# Patient Record
Sex: Female | Born: 2000 | Race: White | Hispanic: No | Marital: Single | Smoking: Never smoker
Health system: Southern US, Community
[De-identification: ages and names within clinical notes are randomized; demographics above are authoritative.]

## PROBLEM LIST (undated history)

## (undated) DIAGNOSIS — J45909 Unspecified asthma, uncomplicated: Secondary | ICD-10-CM

## (undated) HISTORY — PX: ABDOMINAL SURGERY: SHX537

## (undated) HISTORY — PX: HERNIA REPAIR: SHX51

## (undated) HISTORY — PX: APPENDECTOMY: SHX54

---
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
## 2018-12-30 ENCOUNTER — Other Ambulatory Visit: Payer: Self-pay | Admitting: Allergy

## 2018-12-30 ENCOUNTER — Ambulatory Visit
Admission: RE | Admit: 2018-12-30 | Discharge: 2018-12-30 | Disposition: A | Discharge status: Home / Self Care | Payer: BC Managed Care – PPO | Source: Ambulatory Visit | Attending: Allergy | Admitting: Allergy

## 2018-12-30 DIAGNOSIS — J4530 Mild persistent asthma, uncomplicated: Secondary | ICD-10-CM | POA: Diagnosis not present

## 2019-12-31 ENCOUNTER — Encounter: Payer: Self-pay | Admitting: Emergency Medicine

## 2019-12-31 ENCOUNTER — Other Ambulatory Visit: Payer: Self-pay

## 2019-12-31 ENCOUNTER — Ambulatory Visit
Admission: EM | Admit: 2019-12-31 | Discharge: 2019-12-31 | Disposition: A | Discharge status: Home / Self Care | Payer: BC Managed Care – PPO | Attending: Family Medicine | Admitting: Family Medicine

## 2019-12-31 DIAGNOSIS — Z20822 Contact with and (suspected) exposure to covid-19: Secondary | ICD-10-CM | POA: Insufficient documentation

## 2019-12-31 DIAGNOSIS — J0100 Acute maxillary sinusitis, unspecified: Secondary | ICD-10-CM | POA: Diagnosis not present

## 2019-12-31 DIAGNOSIS — J9801 Acute bronchospasm: Secondary | ICD-10-CM | POA: Diagnosis not present

## 2019-12-31 DIAGNOSIS — J069 Acute upper respiratory infection, unspecified: Secondary | ICD-10-CM | POA: Diagnosis present

## 2019-12-31 LAB — SARS CORONAVIRUS 2 (TAT 6-24 HRS): SARS Coronavirus 2: NEGATIVE

## 2019-12-31 MED ORDER — PREDNISONE 20 MG PO TABS: 40.0000 mg | ORAL_TABLET | Freq: Every day | ORAL | 0 refills | Status: DC

## 2019-12-31 MED ORDER — HYDROCOD POLST-CPM POLST ER 10-8 MG/5ML PO SUER: 5.0000 mL | Freq: Every evening | ORAL | 0 refills | Status: DC | PRN

## 2019-12-31 MED ORDER — DOXYCYCLINE HYCLATE 100 MG PO CAPS: 100.0000 mg | ORAL_CAPSULE | Freq: Two times a day (BID) | ORAL | 0 refills | Status: DC

## 2019-12-31 NOTE — Discharge Instructions (Addendum)
Take medication as prescribed. Rest. Drink plenty of fluids. Continue home mucinex and albuterol inhaler as needed.   Follow up with your primary care physician this week as needed. Return to Urgent care for new or worsening concerns.

## 2019-12-31 NOTE — ED Triage Notes (Signed)
Patient c/o cough, chest congestion, sinus congestion, runny nose, and HAs for the past week.  Patient denies fevers.

## 2019-12-31 NOTE — ED Provider Notes (Signed)
[facility] ____________________________________________  Time seen: Approximately 12:51 PM  I have reviewed the triage vital signs and the nursing notes.   HISTORY  Chief Complaint Cough and Nasal Congestion   HPI Briana Fields is a 19 y.o. female present with father at bedside for evaluation of 1 week of cough and congestion complaints.  Patient reports initially started off with sinus congestion and sinus pressure that with postnasal drainage led to chest congestion and coughing.  States she has occasionally had to use her albuterol inhaler with some chest tightness, albuterol inhaler did help.  Denies chest pain or other chest tightness.  States feels like similar when her asthma flares up.  Continues with sinus congestion and intermittent pressure.  States cough does disrupt sleep.  Cough is also present during the day.  States over-the-counter sinus rinses, Mucinex and DayQuil has helped.  Denies any fevers.  Denies any changes in taste or smell.  Patient had COVID-19 in March of this year, and states does not feel the same.  Has not had COVID-19 vaccines.  Denies vomiting or diarrhea.  Denies known sick contacts.  Reports otherwise doing well.  Denies recent antibiotic use.  Patient's last menstrual period was 12/17/2019 (approximate).  Denies pregnancy.  Herb Grays, MD : PCP    Past Medical History:  Diagnosis Date   Asthma     There are no problems to display for this patient.   Past Surgical History:  Procedure Laterality Date   ABDOMINAL SURGERY     APPENDECTOMY     HERNIA REPAIR       No current facility-administered medications for this encounter.  Current Outpatient Medications:    acetaminophen (TYLENOL) 325 MG tablet, Take 650 mg by mouth every 6 (six) hours as needed., Disp: , Rfl:    fluticasone (FLONASE) 50 MCG/ACT nasal spray, Place 1 spray into both nostrils 2 (two) times daily., Disp: , Rfl:    levocetirizine (XYZAL) 5 MG  tablet, SMARTSIG:1 Tablet(s) By Mouth Every Evening, Disp: , Rfl:    montelukast (SINGULAIR) 10 MG tablet, Take 10 mg by mouth daily., Disp: , Rfl:    SPRINTEC 28 0.25-35 MG-MCG tablet, Take 1 tablet by mouth daily., Disp: , Rfl:    SYMBICORT 80-4.5 MCG/ACT inhaler, Inhale 2 puffs into the lungs 2 (two) times daily., Disp: , Rfl:    chlorpheniramine-HYDROcodone (TUSSIONEX PENNKINETIC ER) 10-8 MG/5ML SUER, Take 5 mLs by mouth at bedtime as needed for cough. do not drive or operate machinery while taking as can cause drowsiness., Disp: 40 mL, Rfl: 0   doxycycline (VIBRAMYCIN) 100 MG capsule, Take 1 capsule (100 mg total) by mouth 2 (two) times daily., Disp: 20 capsule, Rfl: 0   predniSONE (DELTASONE) 20 MG tablet, Take 2 tablets (40 mg total) by mouth daily., Disp: 10 tablet, Rfl: 0   sulfamethoxazole-trimethoprim (BACTRIM DS,SEPTRA DS) 800-160 MG tablet, Take 1 tablet by mouth 2 (two) times daily., Disp: 20 tablet, Rfl: 0  Allergies Patient has no known allergies.  Family History  Problem Relation Age of Onset   Healthy Mother    Healthy Father     Social History Social History   Tobacco Use   Smoking status: Never Smoker   Smokeless tobacco: Never Used  Substance Use Topics   Alcohol use: No   Drug use: Not on file    Review of Systems Constitutional: No fever ENT: No sore throat. Cardiovascular: Denies chest pain. Respiratory: Denies shortness of breath. Gastrointestinal: No abdominal pain.  No nausea, no vomiting.  No diarrhea.   Musculoskeletal: Negative for back pain. Skin: Negative for rash.   ____________________________________________   PHYSICAL EXAM:  VITAL SIGNS: ED Triage Vitals  Enc Vitals Group     BP 12/31/19 1142 127/85     Pulse Rate 12/31/19 1142 87     Resp 12/31/19 1142 14     Temp 12/31/19 1142 98.7 F (37.1 C)     Temp Source 12/31/19 1142 Oral     SpO2 12/31/19 1142 100 %     Weight 12/31/19 1138 100 lb (45.4 kg)     Height  12/31/19 1138 5' (1.524 m)     Head Circumference --      Peak Flow --      Pain Score 12/31/19 1138 3     Pain Loc --      Pain Edu? --      Excl. in GC? --     Constitutional: Alert and oriented. Well appearing and in no acute distress. Eyes: Conjunctivae are normal.  Head: Atraumatic.Mild tenderness to palpation bilateral maxillary sinuses.  No frontal sinus tenderness palpation.  No swelling. No erythema.   Ears: no erythema, normal TMs bilaterally.   Nose: nasal congestion with bilateral nasal turbinate erythema and edema.   Mouth/Throat: Mucous membranes are moist.  Oropharynx non-erythematous.No tonsillar swelling or exudate.  Neck: No stridor.  No cervical spine tenderness to palpation. Hematological/Lymphatic/Immunilogical: No cervical lymphadenopathy. Cardiovascular: Normal rate, regular rhythm. Grossly normal heart sounds.  Good peripheral circulation. Respiratory: Normal respiratory effort.  No retractions. No wheezes, rales or rhonchi. Good air movement.  Dry intermittent cough with mild bronchospasm.  Speaks in complete sentences Musculoskeletal: Steady gait.  Neurologic:  Normal speech and language.  Skin:  Skin is warm, dry and intact. No rash noted. Psychiatric: Mood and affect are normal. Speech and behavior are normal.  ___________________________________________   LABS (all labs ordered are listed, but only abnormal results are displayed)  Labs Reviewed  SARS CORONAVIRUS 2 (TAT 6-24 HRS)     PROCEDURES Procedures    INITIAL IMPRESSION / ASSESSMENT AND PLAN / ED COURSE  Pertinent labs & imaging results that were available during my care of the patient were reviewed by me and considered in my medical decision making (see chart for details).  Well-appearing patient.  No acute distress.  Suspect viral upper respiratory infection secondary sinusitis bronchospasm.  Asthmatic history.  COVID-19 testing ordered.  Will treat with oral doxycycline, prednisone,  prnTussionex, continue as needed albuterol inhaler use and nightly before bed and over-the-counter Mucinex.  Supportive care. Discussed indication, risks and benefits of medications with patient.   Discussed follow up with Primary care physician this week. Discussed follow up and return parameters including no resolution or any worsening concerns. Patient verbalized understanding and agreed to plan.   ____________________________________________   FINAL CLINICAL IMPRESSION(S) / ED DIAGNOSES  Final diagnoses:  Viral URI with cough  Bronchospasm  Acute maxillary sinusitis, recurrence not specified     ED Discharge Orders         Ordered    predniSONE (DELTASONE) 20 MG tablet  Daily     Discontinue  Reprint     12/31/19 1216    doxycycline (VIBRAMYCIN) 100 MG capsule  2 times daily     Discontinue  Reprint     12/31/19 1216    chlorpheniramine-HYDROcodone (TUSSIONEX PENNKINETIC ER) 10-8 MG/5ML SUER  At bedtime PRN     Discontinue  Reprint  12/31/19 1216           Note: This dictation was prepared with Dragon dictation along with smaller phrase technology. Any transcriptional errors that result from this process are unintentional.         Renford Dills, NP 12/31/19 1356

## 2020-09-06 IMAGING — CR CHEST - 2 VIEW
2 series · 2 of 2 positions shown · non-contrast
Comparison: May 28, 2011

CLINICAL DATA: Asthma

EXAM:
CHEST - 2 VIEW

[chest pa]
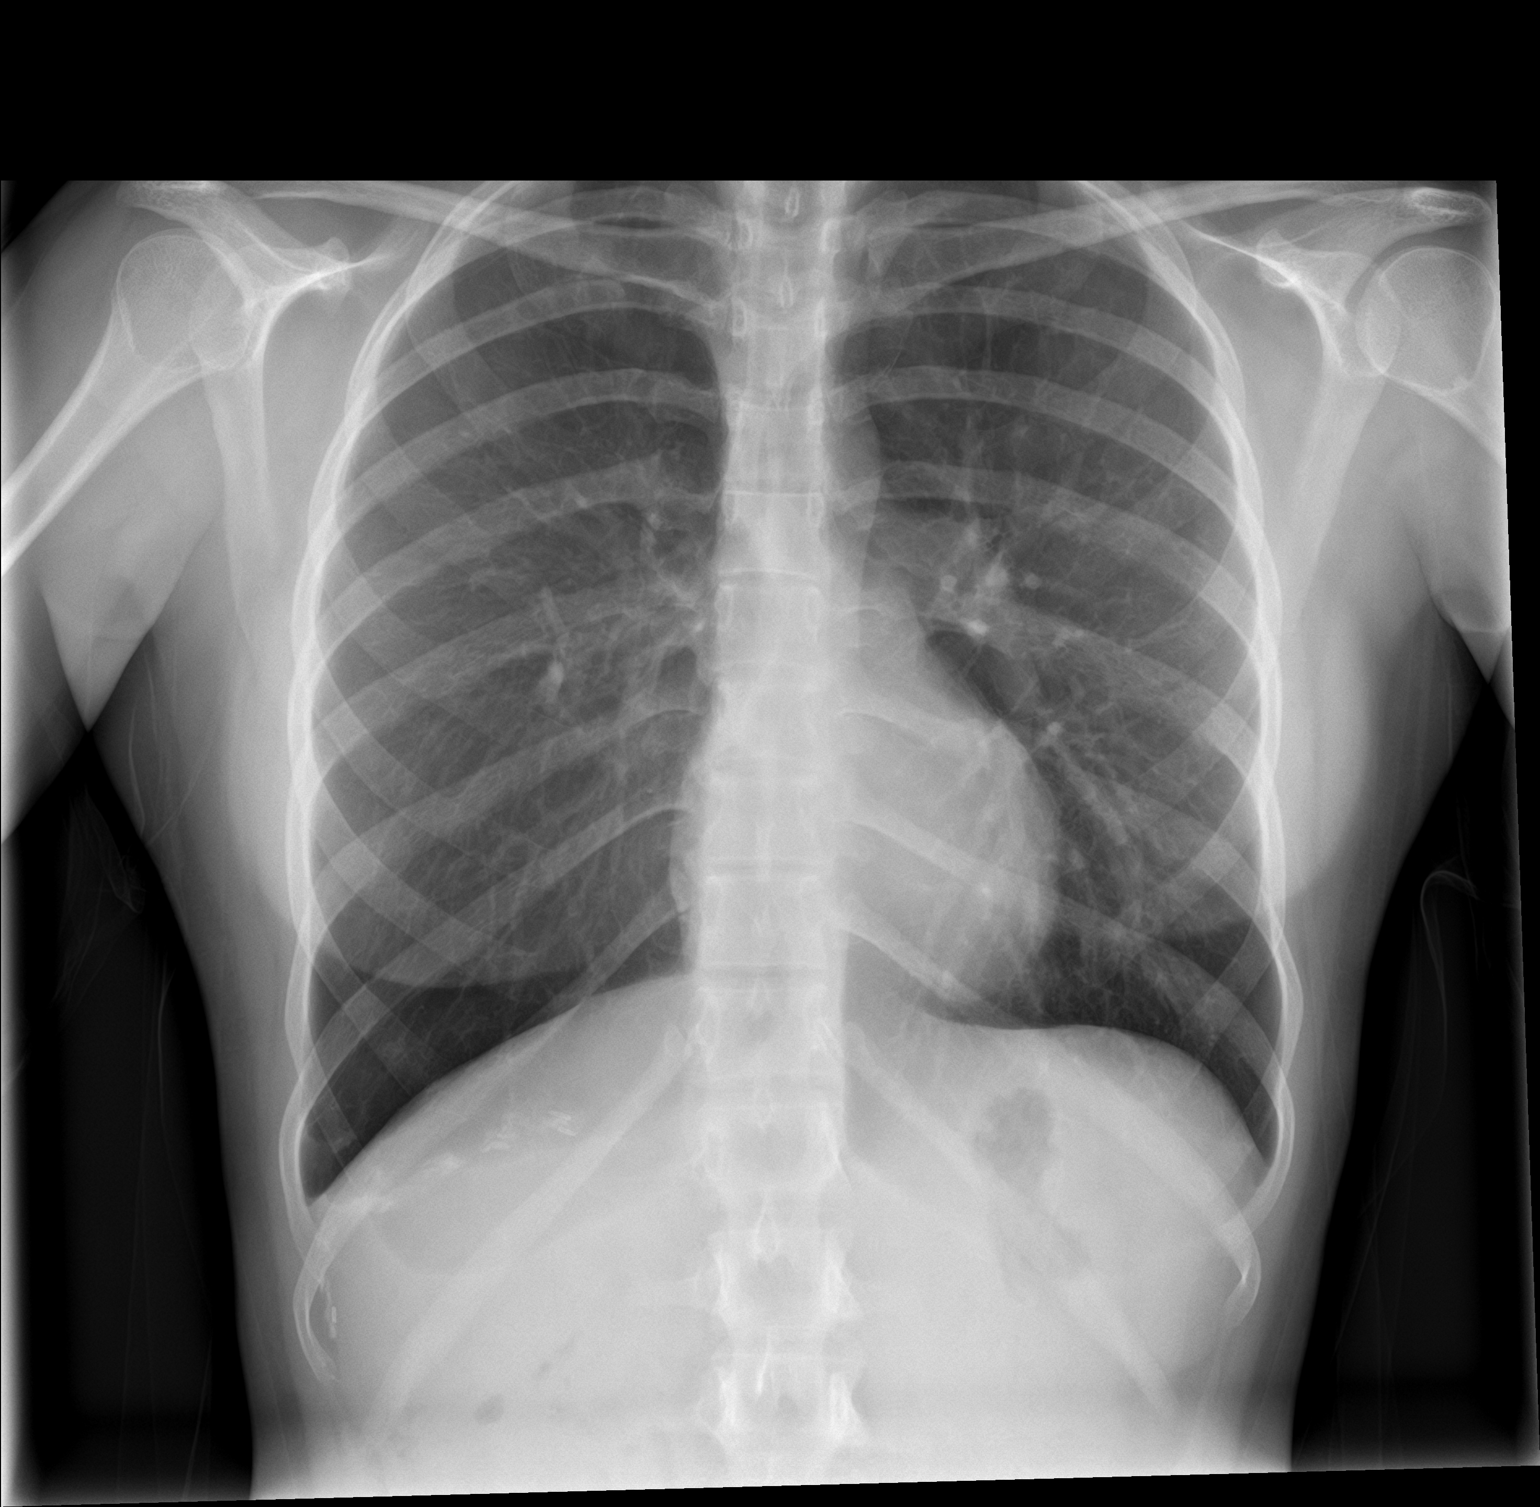

[chest lat]
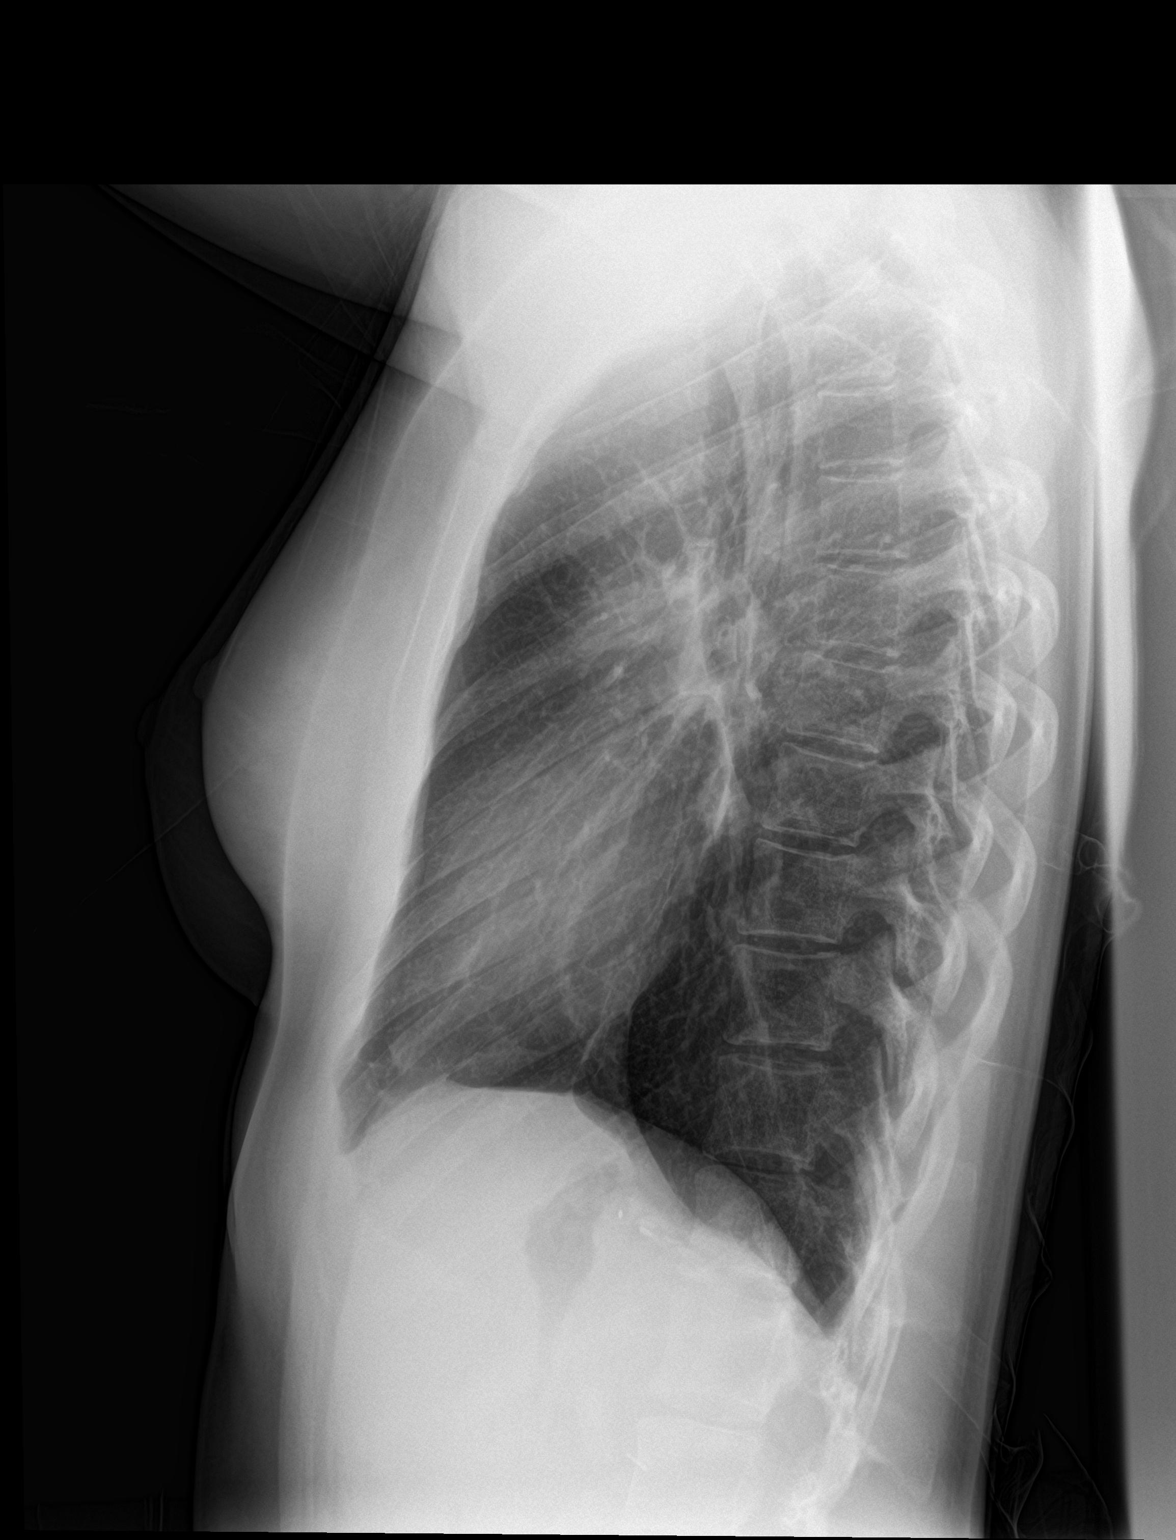

[2 of 2 positions shown; findings below may reference images not displayed]

FINDINGS: Lungs are clear. Heart size and pulmonary vascularity are normal. No
adenopathy. No bone lesions. No pneumothorax.
IMPRESSION: Lungs clear.  No evident adenopathy.

## 2022-01-08 ENCOUNTER — Ambulatory Visit
Admission: EM | Admit: 2022-01-08 | Discharge: 2022-01-08 | Disposition: A | Discharge status: Home / Self Care | Payer: BC Managed Care – PPO | Attending: Physician Assistant | Admitting: Physician Assistant

## 2022-01-08 ENCOUNTER — Encounter: Payer: Self-pay | Admitting: Emergency Medicine

## 2022-01-08 DIAGNOSIS — N3000 Acute cystitis without hematuria: Secondary | ICD-10-CM | POA: Diagnosis not present

## 2022-01-08 DIAGNOSIS — R1084 Generalized abdominal pain: Secondary | ICD-10-CM

## 2022-01-08 DIAGNOSIS — R112 Nausea with vomiting, unspecified: Secondary | ICD-10-CM

## 2022-01-08 LAB — COMPREHENSIVE METABOLIC PANEL
ALT: 14 U/L (ref 0–44)
AST: 20 U/L (ref 15–41)
Albumin: 4.2 g/dL (ref 3.5–5.0)
Alkaline Phosphatase: 56 U/L (ref 38–126)
Anion gap: 11 (ref 5–15)
BUN: 11 mg/dL (ref 6–20)
CO2: 26 mmol/L (ref 22–32)
Calcium: 9.2 mg/dL (ref 8.9–10.3)
Chloride: 101 mmol/L (ref 98–111)
Creatinine, Ser: 0.71 mg/dL (ref 0.44–1.00)
GFR, Estimated: >60 mL/min (ref >60)
Glucose, Bld: 99 mg/dL (ref 70–99)
Potassium: 4.1 mmol/L (ref 3.5–5.1)
Sodium: 138 mmol/L (ref 135–145)
Total Bilirubin: 0.7 mg/dL (ref 0.3–1.2)
Total Protein: 8.2 g/dL — ABNORMAL HIGH (ref 6.5–8.1)

## 2022-01-08 LAB — CBC WITH DIFFERENTIAL/PLATELET
Abs Immature Granulocytes: 0.03 10*3/uL (ref 0.00–0.07)
Basophils Absolute: 0.1 10*3/uL (ref 0.0–0.1)
Basophils Relative: 1 %
Eosinophils Absolute: 0 10*3/uL (ref 0.0–0.5)
Eosinophils Relative: 0 %
HCT: 44.1 % (ref 36.0–46.0)
Hemoglobin: 14.7 g/dL (ref 12.0–15.0)
Immature Granulocytes: 0 %
Lymphocytes Relative: 13 %
Lymphs Abs: 1.4 10*3/uL (ref 0.7–4.0)
MCH: 30.1 pg (ref 26.0–34.0)
MCHC: 33.3 g/dL (ref 30.0–36.0)
MCV: 90.2 fL (ref 80.0–100.0)
Monocytes Absolute: 0.6 10*3/uL (ref 0.1–1.0)
Monocytes Relative: 5 %
Neutro Abs: 9.3 10*3/uL — ABNORMAL HIGH (ref 1.7–7.7)
Neutrophils Relative %: 81 %
Platelets: 321 10*3/uL (ref 150–400)
RBC: 4.89 MIL/uL (ref 3.87–5.11)
RDW: 12.7 % (ref 11.5–15.5)
WBC: 11.4 10*3/uL — ABNORMAL HIGH (ref 4.0–10.5)
nRBC: 0 % (ref 0.0–0.2)

## 2022-01-08 LAB — URINALYSIS, ROUTINE W REFLEX MICROSCOPIC
Glucose, UA: NEGATIVE mg/dL
Hgb urine dipstick: NEGATIVE
Ketones, ur: >160 mg/dL — AB
Nitrite: NEGATIVE
Protein, ur: 30 mg/dL — AB
Specific Gravity, Urine: 1.015 (ref 1.005–1.030)
pH: 7.5 (ref 5.0–8.0)

## 2022-01-08 LAB — URINALYSIS, MICROSCOPIC (REFLEX): RBC / HPF: NONE SEEN RBC/hpf (ref 0–5)

## 2022-01-08 LAB — LIPASE, BLOOD: Lipase: 24 U/L (ref 11–51)

## 2022-01-08 LAB — PREGNANCY, URINE: Preg Test, Ur: NEGATIVE

## 2022-01-08 MED ORDER — DICYCLOMINE HCL 20 MG PO TABS: 20.0000 mg | ORAL_TABLET | Freq: Three times a day (TID) | ORAL | 0 refills | Status: AC

## 2022-01-08 MED ORDER — ONDANSETRON 8 MG PO TBDP
8.0000 mg | ORAL_TABLET | Freq: Once | ORAL | Status: AC
  Administered 2022-01-08: 8 mg via ORAL

## 2022-01-08 MED ORDER — KETOROLAC TROMETHAMINE 60 MG/2ML IM SOLN
30.0000 mg | Freq: Once | INTRAMUSCULAR | Status: AC
  Administered 2022-01-08: 30 mg via INTRAMUSCULAR

## 2022-01-08 MED ORDER — NITROFURANTOIN MONOHYD MACRO 100 MG PO CAPS: 100.0000 mg | ORAL_CAPSULE | Freq: Two times a day (BID) | ORAL | 0 refills | Status: AC

## 2022-01-08 MED ORDER — ONDANSETRON 4 MG PO TBDP: 4.0000 mg | ORAL_TABLET | Freq: Four times a day (QID) | ORAL | 0 refills | Status: AC | PRN

## 2022-01-08 NOTE — ED Provider Notes (Signed)
[facility]    CSN: [id number] Arrival date & time: 01/08/22  0806      History   Chief Complaint Chief Complaint  Patient presents with   Abdominal Pain   Emesis   Nausea    HPI Briana Fields is a 21 y.o. female presenting with her mother for onset of epigastric and diffuse upper abdominal pain as well as suprapubic and diffuse lower abdominal pain yesterday.  It is accompanied by nausea.  Patient reports the pain makes her so nauseous that she vomits.  She denies any fever, chills, fatigue, cough, congestion, sore throat.  Denies breathing difficulty, diarrhea or constipation.  Reports normal BM yesterday.  Denies dysuria, flank pain, hematuria, urinary frequency, vaginal discharge.  No report of any concern for STIs.  Patient has taken Tylenol for pain without relief.  She reports that it comes and goes but is squeezing, cramping and gnawing in her epigastric area and suprapubic region when it happens.  She does have a history of diaphragmatic hernia when she was an infant.  Had abdominal surgery at that time for repair of this hernia and appendectomy.  Mother reports that she has not had any issues and was cleared by GI after she was [age]-year-old.  Patient says she has had cramping like this before but usually goes away and she says this time is stronger.  She reports last time it happened was 2 to 3 months ago.  Patient reports she eats a healthy diet.  She says he used to get the cramping pains in the epigastric area every month.  Reports she cannot identify any foods that make it worse and it is not associated with any urinary complaints or change in bowel habits.  She says she discussed with her PCP potentially being referred to GI in the past.  HPI  Past Medical History:  Diagnosis Date   Asthma     There are no problems to display for this patient.   Past Surgical History:  Procedure Laterality Date   ABDOMINAL SURGERY     APPENDECTOMY     HERNIA REPAIR       OB History     Gravida  0   Para  0   Term  0   Preterm  0   AB  0   Living  0      SAB  0   IAB  0   Ectopic  0   Multiple  0   Live Births               Home Medications    Prior to Admission medications   Medication Sig Start Date End Date Taking? Authorizing Provider  acetaminophen (TYLENOL) 325 MG tablet Take 650 mg by mouth every 6 (six) hours as needed.   Yes [provider]  dicyclomine (BENTYL) 20 MG tablet Take 1 tablet (20 mg total) by mouth 3 (three) times daily before meals. 01/08/22  Yes Eusebio Friendly B, PA-C  fluticasone (FLONASE) 50 MCG/ACT nasal spray Place 1 spray into both nostrils 2 (two) times daily. 12/27/19  Yes [provider]  levocetirizine (XYZAL) 5 MG tablet SMARTSIG:1 Tablet(s) By Mouth Every Evening 12/11/19  Yes [provider]  montelukast (SINGULAIR) 10 MG tablet Take 10 mg by mouth daily. 12/15/19  Yes [provider]  nitrofurantoin, macrocrystal-monohydrate, (MACROBID) 100 MG capsule Take 1 capsule (100 mg total) by mouth 2 (two) times daily. 01/08/22  Yes Shirlee Latch, PA-C  ondansetron (ZOFRAN-ODT) 4 MG disintegrating tablet Take 1 tablet (4 mg total) by mouth every 6 (six) hours as needed for up to 5 days for nausea or vomiting. 01/08/22 01/13/22 Yes Shirlee Latch, PA-C  SPRINTEC 28 0.25-35 MG-MCG tablet Take 1 tablet by mouth daily. 12/18/19  Yes [provider]  SYMBICORT 80-4.5 MCG/ACT inhaler Inhale 2 puffs into the lungs 2 (two) times daily. 12/20/19  Yes [provider]  Vilazodone HCl 20 MG TABS Take by mouth. 01/01/22 01/31/22 Yes [provider]    Family History Family History  Problem Relation Age of Onset   Healthy Mother    Healthy Father     Social History Social History   Tobacco Use   Smoking status: Never   Smokeless tobacco: Never  Vaping Use   Vaping Use: Never used  Substance Use Topics   Alcohol use: No     Allergies   Patient has  no known allergies.   Review of Systems Review of Systems  Constitutional:  Negative for fatigue and fever.  HENT:  Negative for congestion.   Respiratory:  Negative for cough and shortness of breath.   Cardiovascular:  Negative for chest pain.  Gastrointestinal:  Positive for abdominal pain, nausea and vomiting. Negative for constipation and diarrhea.  Genitourinary:  Negative for difficulty urinating, dysuria, flank pain, frequency, pelvic pain and vaginal discharge.  Musculoskeletal:  Negative for back pain.  Neurological:  Negative for dizziness and weakness.     Physical Exam Triage Vital Signs ED Triage Vitals  Enc Vitals Group     BP      Pulse      Resp      Temp      Temp src      SpO2      Weight      Height      Head Circumference      Peak Flow      Pain Score      Pain Loc      Pain Edu?      Excl. in GC?    No data found.  Updated Vital Signs BP (!) 129/96 (BP Location: Right Arm)   Pulse (!) 101   Temp 98.6 F (37 C) (Oral)   Resp 16   LMP  (LMP Unknown)   SpO2 99%      Physical Exam Vitals and nursing note reviewed.  Constitutional:      General: She is not in acute distress.    Appearance: Normal appearance. She is not ill-appearing or toxic-appearing.  HENT:     Head: Normocephalic and atraumatic.     Nose: Nose normal.     Mouth/Throat:     Mouth: Mucous membranes are moist.     Pharynx: Oropharynx is clear.  Eyes:     General: No scleral icterus.       Right eye: No discharge.        Left eye: No discharge.     Conjunctiva/sclera: Conjunctivae normal.  Cardiovascular:     Rate and Rhythm: Regular rhythm. Tachycardia present.     Heart sounds: Normal heart sounds.  Pulmonary:     Effort: Pulmonary effort is normal. No respiratory distress.     Breath sounds: Normal breath sounds.  Abdominal:     Palpations: Abdomen is soft.     Tenderness: There is generalized abdominal tenderness. There is guarding (suprapubic, epigastric).  There is no right CVA tenderness or left CVA tenderness.  Musculoskeletal:     Cervical back: Neck supple.  Skin:    General: Skin is dry.  Neurological:     General: No focal deficit present.     Mental Status: She is alert. Mental status is at baseline.     Motor: No weakness.     Gait: Gait normal.  Psychiatric:        Mood and Affect: Mood normal.        Behavior: Behavior normal.        Thought Content: Thought content normal.      UC Treatments / Results  Labs (all labs ordered are listed, but only abnormal results are displayed) Labs Reviewed  URINALYSIS, ROUTINE W REFLEX MICROSCOPIC - Abnormal; Notable for the following components:      Result Value   Bilirubin Urine SMALL (*)    Ketones, ur >160 (*)    Protein, ur 30 (*)    Leukocytes,Ua SMALL (*)    All other components within normal limits  URINALYSIS, MICROSCOPIC (REFLEX) - Abnormal; Notable for the following components:   Bacteria, UA FEW (*)    All other components within normal limits  COMPREHENSIVE METABOLIC PANEL - Abnormal; Notable for the following components:   Total Protein 8.2 (*)    All other components within normal limits  CBC WITH DIFFERENTIAL/PLATELET - Abnormal; Notable for the following components:   WBC 11.4 (*)    Neutro Abs 9.3 (*)    All other components within normal limits  URINE CULTURE  PREGNANCY, URINE  LIPASE, BLOOD    EKG   Radiology No results found.  Procedures Procedures (including critical care time)  Medications Ordered in UC Medications  ondansetron (ZOFRAN-ODT) disintegrating tablet 8 mg (8 mg Oral Given 01/08/22 0955)  ketorolac (TORADOL) injection 30 mg (30 mg Intramuscular Given 01/08/22 1010)    Initial Impression / Assessment and Plan / UC Course  I have reviewed the triage vital signs and the nursing notes.  Pertinent labs & imaging results that were available during my care of the patient were reviewed by me and considered in my medical decision making  (see chart for details).  21 year old female presenting for generalized abdominal pain which is worse in the suprapubic region and epigastric region accompanied by nausea and vomiting.  Symptom onset yesterday.  No fever.  No diarrhea or constipation.  No urinary symptoms.  Patient is mildly tachycardic.  She appears uncomfortable but not toxic.  Abdomen is soft with generalized tenderness and some guarding about the suprapubic region and epigastric region.  Chest is clear auscultation heart regular rate and rhythm.  Urine pregnancy test negative.  Urinalysis shows small bili, ketones, protein and small leukocytes with few bacteria.  We will send urine for culture as patient may have potential UTI.  Ordered CBC, CMP and lipase to assess for any signs of acute abdomen or to see if abdominal imaging may be indicated.  Patient given 30 mg IM ketorolac in clinic for abdominal pain and 8 mg ODT Zofran for her nausea.  Patient reports a lot of improvement with the ketorolac and Zofran.  CMP essentially normal.  CBC shows slight elevated WBCs at 11.4, otherwise reassuring.  Lipase normal.  Given her suprapubic pain and leukocytes in urine with bacteria, will treat for potential UTI at this time.  Sent Macrobid to pharmacy.  Encouraged increasing rest and fluids.  The epigastric pain and nausea seems to be more related to a chronic condition.  We will try dicyclomine at  this time.  Encouraged her to also try the Tums which she says has helped in the past.  Parke Simmers diet.  Advised making a follow-up appoint with PCP and discussing a GI referral further.  Advised patient if her pain worsens at any point or she develops a fever, she should go to emergency department for more immediate evaluation and imaging.   Final Clinical Impressions(s) / UC Diagnoses   Final diagnoses:  Generalized abdominal pain  Acute cystitis without hematuria  Nausea and vomiting, unspecified vomiting type     Discharge  Instructions      ABDOMINAL PAIN: You may take Tylenol for pain relief. Use medications as directed including antiemetics. You should increase fluids and electrolytes as well as rest over these next several days. Consider Senna over the counter if you haven't had a BM by end of day tomorrow. If you have any questions or concerns, or if your symptoms are not improving or if especially if they acutely worsen, please call or stop back to the clinic immediately and we will be happy to help you or go to the ER   ABDOMINAL PAIN RED FLAGS: Seek immediate further care if: symptoms remain the same or worsen over the next 3-7 days, you are unable to keep fluids down, you see blood or mucus in your stool, you vomit black or dark red material, you have a fever of 101.F or higher, you have localized and/or persistent abdominal pain    UTI: Based on either symptoms or urinalysis, you may have a urinary tract infection. We will send the urine for culture and call with results in a few days. Begin antibiotics at this time. Your symptoms should be much improved over the next 2-3 days. Increase rest and fluid intake. If for some reason symptoms are worsening or not improving after a couple of days or the urine culture determines the antibiotics you are taking will not treat the infection, the antibiotics may be changed. Return or go to ER for fever, back pain, worsening urinary pain, discharge, increased blood in urine. May take Tylenol or Motrin OTC for pain relief or consider AZO if no contraindications.  Reach out to your PCP given intermittent GI symptoms to see if you can get a referral to GI or imaging.     ED Prescriptions     Medication Sig Dispense Auth. Provider   nitrofurantoin, macrocrystal-monohydrate, (MACROBID) 100 MG capsule Take 1 capsule (100 mg total) by mouth 2 (two) times daily. 10 capsule Eusebio Friendly B, PA-C   ondansetron (ZOFRAN-ODT) 4 MG disintegrating tablet Take 1 tablet (4 mg total) by  mouth every 6 (six) hours as needed for up to 5 days for nausea or vomiting. 15 tablet Eusebio Friendly B, PA-C   dicyclomine (BENTYL) 20 MG tablet Take 1 tablet (20 mg total) by mouth 3 (three) times daily before meals. 20 tablet Gareth Morgan      PDMP not reviewed this encounter.   Shirlee Latch, PA-C 01/08/22 1119

## 2022-01-08 NOTE — ED Triage Notes (Signed)
Pt c/o suprapubic and epigastric abdominal cramping since yesterday with nausea vomiting.

## 2022-01-08 NOTE — Discharge Instructions (Addendum)
ABDOMINAL PAIN: You may take Tylenol for pain relief. Use medications as directed including antiemetics. You should increase fluids and electrolytes as well as rest over these next several days. Consider Senna over the counter if you haven't had a BM by end of day tomorrow. If you have any questions or concerns, or if your symptoms are not improving or if especially if they acutely worsen, please call or stop back to the clinic immediately and we will be happy to help you or go to the ER   ABDOMINAL PAIN RED FLAGS: Seek immediate further care if: symptoms remain the same or worsen over the next 3-7 days, you are unable to keep fluids down, you see blood or mucus in your stool, you vomit black or dark red material, you have a fever of 101.F or higher, you have localized and/or persistent abdominal pain    UTI: Based on either symptoms or urinalysis, you may have a urinary tract infection. We will send the urine for culture and call with results in a few days. Begin antibiotics at this time. Your symptoms should be much improved over the next 2-3 days. Increase rest and fluid intake. If for some reason symptoms are worsening or not improving after a couple of days or the urine culture determines the antibiotics you are taking will not treat the infection, the antibiotics may be changed. Return or go to ER for fever, back pain, worsening urinary pain, discharge, increased blood in urine. May take Tylenol or Motrin OTC for pain relief or consider AZO if no contraindications.  Reach out to your PCP given intermittent GI symptoms to see if you can get a referral to GI or imaging.

## 2022-01-09 LAB — URINE CULTURE: Culture: <10000 — AB

## 2022-04-09 ENCOUNTER — Other Ambulatory Visit: Payer: Self-pay | Admitting: Gastroenterology

## 2022-04-09 DIAGNOSIS — K219 Gastro-esophageal reflux disease without esophagitis: Secondary | ICD-10-CM

## 2022-04-09 DIAGNOSIS — R1013 Epigastric pain: Secondary | ICD-10-CM

## 2022-04-09 DIAGNOSIS — R110 Nausea: Secondary | ICD-10-CM

## 2022-07-02 ENCOUNTER — Other Ambulatory Visit: Payer: Self-pay | Admitting: Gastroenterology

## 2022-07-02 DIAGNOSIS — R1013 Epigastric pain: Secondary | ICD-10-CM

## 2022-07-02 DIAGNOSIS — R112 Nausea with vomiting, unspecified: Secondary | ICD-10-CM

## 2022-07-03 ENCOUNTER — Ambulatory Visit
Admission: RE | Admit: 2022-07-03 | Discharge: 2022-07-03 | Disposition: A | Discharge status: Home / Self Care | Payer: 59 | Source: Ambulatory Visit | Attending: Gastroenterology | Admitting: Gastroenterology

## 2022-07-03 DIAGNOSIS — R112 Nausea with vomiting, unspecified: Secondary | ICD-10-CM | POA: Diagnosis present

## 2022-07-03 DIAGNOSIS — R1013 Epigastric pain: Secondary | ICD-10-CM | POA: Diagnosis not present

## 2022-07-03 MED ORDER — IOHEXOL 300 MG/ML  SOLN
100.0000 mL | Freq: Once | INTRAMUSCULAR | Status: AC | PRN
  Administered 2022-07-03: 100 mL via INTRAVENOUS

## 2022-08-08 ENCOUNTER — Ambulatory Visit: Payer: 59

## 2022-08-08 DIAGNOSIS — K2100 Gastro-esophageal reflux disease with esophagitis, without bleeding: Secondary | ICD-10-CM | POA: Diagnosis not present

## 2022-08-08 DIAGNOSIS — K2950 Unspecified chronic gastritis without bleeding: Secondary | ICD-10-CM | POA: Diagnosis not present
